# Patient Record
Sex: Female | Born: 1950 | Race: White | Hispanic: No | Smoking: Never smoker
Health system: Southern US, Community
[De-identification: ages and names within clinical notes are randomized; demographics above are authoritative.]

## PROBLEM LIST (undated history)

## (undated) DIAGNOSIS — C4491 Basal cell carcinoma of skin, unspecified: Secondary | ICD-10-CM

## (undated) DIAGNOSIS — Z8619 Personal history of other infectious and parasitic diseases: Secondary | ICD-10-CM

## (undated) DIAGNOSIS — T7840XA Allergy, unspecified, initial encounter: Secondary | ICD-10-CM

## (undated) HISTORY — DX: Allergy, unspecified, initial encounter: T78.40XA

## (undated) HISTORY — DX: Personal history of other infectious and parasitic diseases: Z86.19

## (undated) HISTORY — PX: TUBAL LIGATION: SHX77

## (undated) HISTORY — DX: Basal cell carcinoma of skin, unspecified: C44.91

---

## 2000-11-09 ENCOUNTER — Other Ambulatory Visit: Admission: RE | Admit: 2000-11-09 | Discharge: 2000-11-09 | Payer: Self-pay | Admitting: Obstetrics and Gynecology

## 2000-11-17 ENCOUNTER — Encounter
Admission: RE | Admit: 2000-11-17 | Discharge: 2000-11-17 | Payer: Self-pay | Admitting: Physical Medicine & Rehabilitation

## 2000-11-17 ENCOUNTER — Encounter: Payer: Self-pay | Admitting: Obstetrics and Gynecology

## 2000-11-26 ENCOUNTER — Other Ambulatory Visit: Admission: RE | Admit: 2000-11-26 | Discharge: 2000-11-26 | Payer: Self-pay | Admitting: Obstetrics and Gynecology

## 2005-01-27 ENCOUNTER — Ambulatory Visit (HOSPITAL_COMMUNITY): Admission: RE | Admit: 2005-01-27 | Discharge: 2005-01-27 | Payer: Self-pay | Admitting: Family Medicine

## 2006-03-24 ENCOUNTER — Other Ambulatory Visit: Admission: RE | Admit: 2006-03-24 | Discharge: 2006-03-24 | Payer: Self-pay | Admitting: Family Medicine

## 2013-12-03 ENCOUNTER — Ambulatory Visit (INDEPENDENT_AMBULATORY_CARE_PROVIDER_SITE_OTHER): Payer: BC Managed Care – PPO | Admitting: Family Medicine

## 2013-12-03 VITALS — BP 120/72 | HR 92 | Temp 98.4°F | Resp 18 | Ht 63.0 in | Wt 158.2 lb

## 2013-12-03 DIAGNOSIS — J329 Chronic sinusitis, unspecified: Secondary | ICD-10-CM

## 2013-12-03 DIAGNOSIS — J209 Acute bronchitis, unspecified: Secondary | ICD-10-CM

## 2013-12-03 MED ORDER — AZITHROMYCIN 250 MG PO TABS: ORAL_TABLET | ORAL | Status: DC

## 2013-12-03 NOTE — Progress Notes (Signed)
° °  Subjective:  This chart was scribed for Elvina Sidle, MD by Carl Best, Medical Scribe. This patient was seen in Room 2 and the patient's care was started at 8:09 AM.  Patient ID: Brittney Holt, female    DOB: 12-22-1951, 62 y.o.   MRN: [id number]  HPI HPI Comments: Brittney Holt is a 62 y.o. female who presents to the [facility] complaining of constant sinus pressure, cough, and sore throat that started last Saturday.  She states that her mucous is green in color.  She denies fever as an associated symptom.  She denies having a history of asthma.  She states that she has taken Zithromax with no problem.  The patient states that she is working in the [employer] at [employer].  She states that she has been working at [employer] since 1980.    Past Medical History  Diagnosis Date   Allergy    Past Surgical History  Procedure Laterality Date   Tubal ligation     Family History  Problem Relation Age of Onset   Cancer Mother    Heart disease Father    History   Social History   Marital Status: Divorced    Spouse Name: N/A    Number of Children: N/A   Years of Education: N/A   Occupational History   Not on file.   Social History Main Topics   Smoking status: Never Smoker    Smokeless tobacco: Not on file   Alcohol Use: Yes   Drug Use: No   Sexual Activity: Not on file   Other Topics Concern   Not on file   Social History Narrative   No narrative on file   Allergies  Allergen Reactions   Levaquin [Levofloxacin] Other (See Comments)    States that it raised something in her blood     Review of Systems  Constitutional: Negative for fever.  HENT: Positive for sinus pressure and sore throat.   Respiratory: Positive for cough.      Objective:  Physical Exam  Nursing note and vitals reviewed. Constitutional: She appears well-developed and well-nourished. No distress.  HENT:  Head: Normocephalic and  atraumatic.  Right Ear: External ear normal.  Left Ear: External ear normal.   Few ronchi Oroph:  Reddened posterior pharynx without exudates or swelling     Assessment & Plan:   I personally performed the services described in this documentation, which was scribed in my presence. The recorded information has been reviewed and is accurate. Sinusitis - Plan: azithromycin (ZITHROMAX Z-PAK) 250 MG tablet  Acute bronchitis - Plan: azithromycin (ZITHROMAX Z-PAK) 250 MG tablet  Signed, Elvina Sidle, MD

## 2015-02-14 ENCOUNTER — Ambulatory Visit (INDEPENDENT_AMBULATORY_CARE_PROVIDER_SITE_OTHER): Payer: Medicare Other

## 2015-02-14 ENCOUNTER — Ambulatory Visit (INDEPENDENT_AMBULATORY_CARE_PROVIDER_SITE_OTHER): Payer: Medicare Other | Admitting: Emergency Medicine

## 2015-02-14 VITALS — BP 120/76 | HR 116 | Temp 98.0°F | Resp 16 | Ht 62.5 in | Wt 177.0 lb

## 2015-02-14 DIAGNOSIS — R059 Cough, unspecified: Secondary | ICD-10-CM

## 2015-02-14 DIAGNOSIS — R05 Cough: Secondary | ICD-10-CM

## 2015-02-14 DIAGNOSIS — J189 Pneumonia, unspecified organism: Secondary | ICD-10-CM

## 2015-02-14 MED ORDER — HYDROCOD POLST-CHLORPHEN POLST 10-8 MG/5ML PO LQCR: 5.0000 mL | Freq: Two times a day (BID) | ORAL | Status: DC | PRN

## 2015-02-14 MED ORDER — CLARITHROMYCIN 500 MG PO TABS: 500.0000 mg | ORAL_TABLET | Freq: Two times a day (BID) | ORAL | Status: DC

## 2015-02-14 NOTE — Patient Instructions (Signed)

## 2015-02-14 NOTE — Progress Notes (Signed)
[facility] [address] [place] [address] [phone]  Date:  02/14/2015   Name:  Brittney Holt   DOB:  Dec 03, 1951   MRN:  [id number]  PCP:  No PCP Per Patient    Chief Complaint: Cough; Fever; and Wheezing   History of Present Illness:  Brittney Holt is a 64 y.o. very pleasant female patient who presents with the following:  Ill since Saturday night with cough productive mucopurulent sputum.   Wheezing intermittently.  Some exertional shortness of breath Fever to 102.  Spent two days in bed due to fatigue. No history of reactive airway disease. Nasal congestion and drainage mucopurulent. No nausea or vomiting.  No stool change. No improvement with over the counter medications or other home remedies.  Denies other complaint or health concern today.   There are no active problems to display for this patient.   Past Medical History  Diagnosis Date  . Allergy     Past Surgical History  Procedure Laterality Date  . Tubal ligation      History  Substance Use Topics  . Smoking status: Never Smoker   . Smokeless tobacco: Not on file  . Alcohol Use: Yes    Family History  Problem Relation Age of Onset  . Cancer Mother   . Heart disease Father     Allergies  Allergen Reactions  . Levaquin [Levofloxacin] Other (See Comments)    States that it raised something in her blood    Medication list has been reviewed and updated.  Current Outpatient Prescriptions on File Prior to Visit  Medication Sig Dispense Refill  . azithromycin (ZITHROMAX Z-PAK) 250 MG tablet Take as directed on pack (Patient not taking: Reported on 02/14/2015) 6 tablet 0  . Multiple Vitamin (MULTIVITAMIN) capsule Take 1 capsule by mouth daily.     No current facility-administered medications on file prior to visit.    Review of Systems:  As per HPI, otherwise negative.    Physical Examination: Filed Vitals:   02/14/15 1008  BP: 120/76  Pulse: 116  Temp: 98  F (36.7 C)  Resp: 16   Filed Vitals:   02/14/15 1008  Height: 5' 2.5" (1.588 m)  Weight: 177 lb (80.287 kg)   Body mass index is 31.84 kg/(m^2). Ideal Body Weight: Weight in (lb) to have BMI = 25: 138.6  GEN: obese, NAD, Non-toxic, A & O x 3 HEENT: Atraumatic, Normocephalic. Neck supple. No masses, No LAD. Ears and Nose: No external deformity.  Purulent nasal drainage CV: RRR, No M/G/R. No JVD. No thrill. No extra heart sounds. PULM: CTA B, no wheezes, crackles, rhonchi. No retractions. No resp. distress. No accessory muscle use. ABD: S, NT, ND, +BS. No rebound. No HSM. EXTR: No c/c/e NEURO Normal gait.  PSYCH: Normally interactive. Conversant. Not depressed or anxious appearing.  Calm demeanor.    Assessment and Plan: RLL infiltrate levaquin tussionex  Signed,  Ellison Carwin, MD   [facility] reading (PRIMARY) by  Dr. Ouida Sills.  RLL infiltrate.

## 2015-07-10 IMAGING — CR DG CHEST 2V
2 series · 2 of 2 positions shown · non-contrast
Comparison: January 27, 2005

CLINICAL DATA: Productive cough for 5 days.  Fever

EXAM:
CHEST  2 VIEW

[PA]
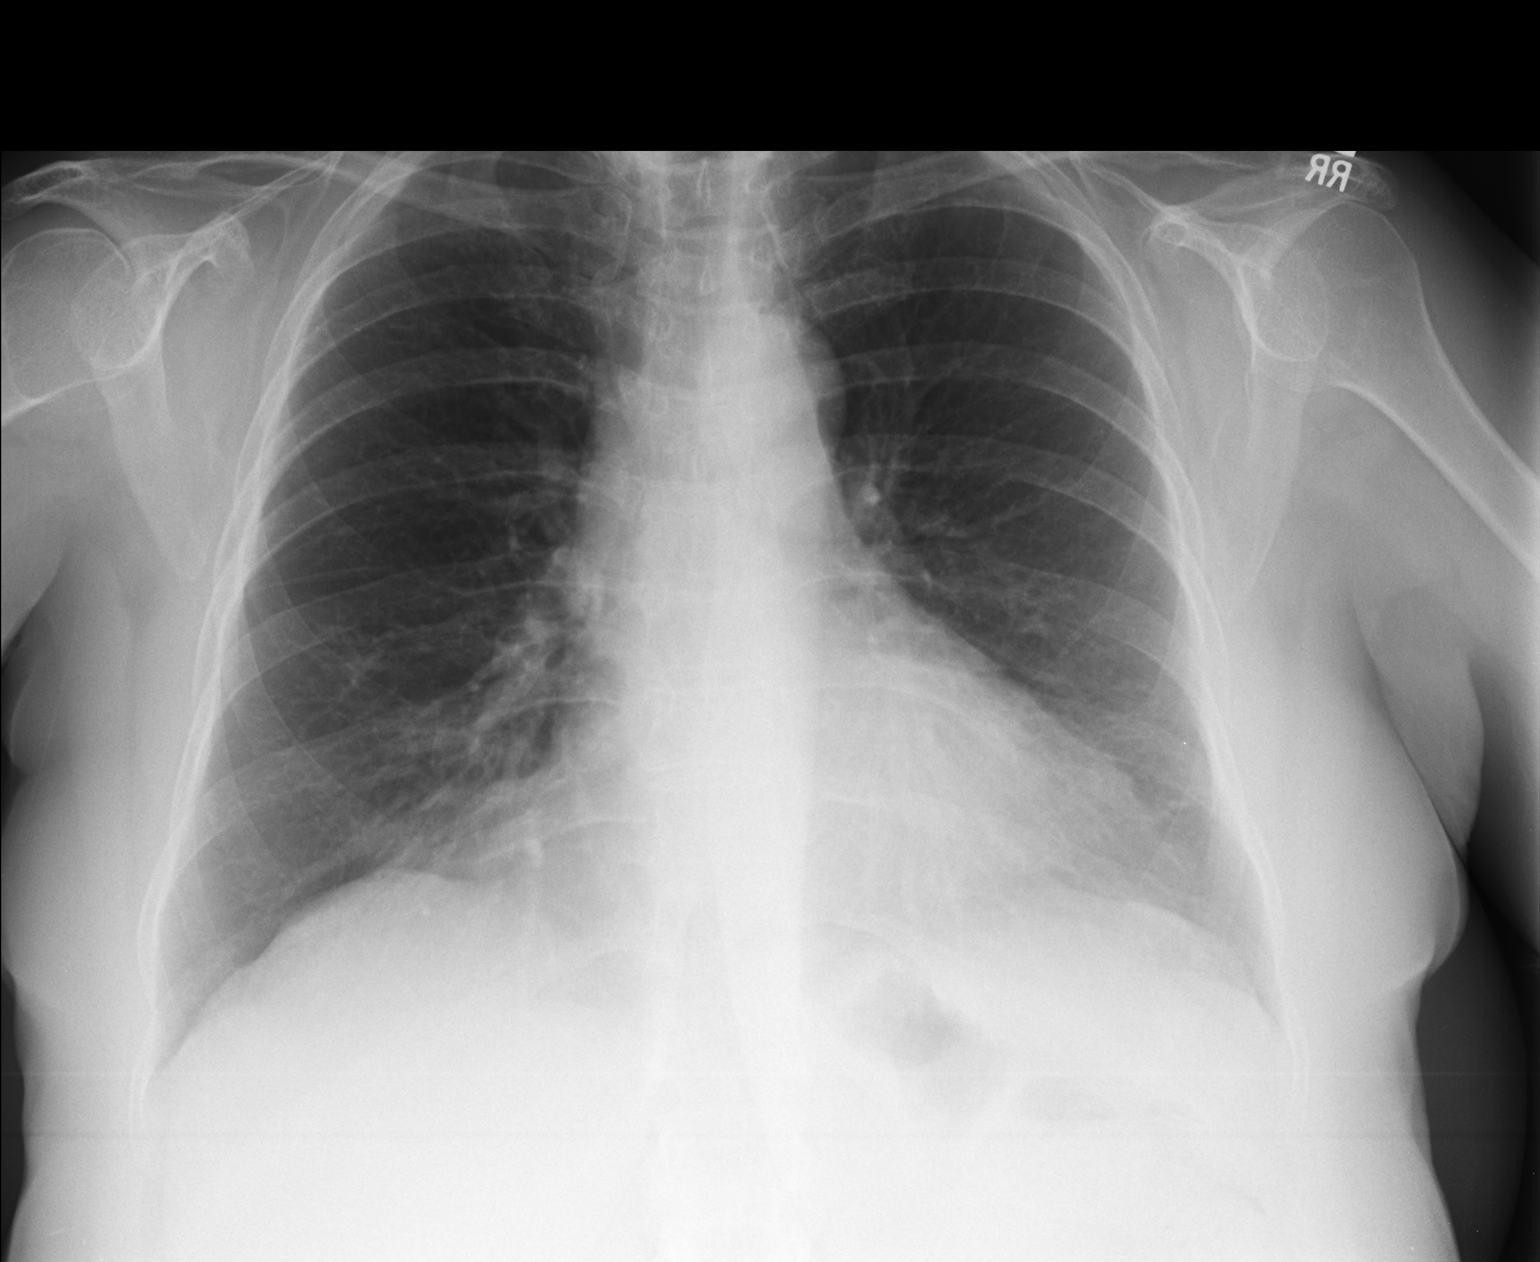

[lateral]
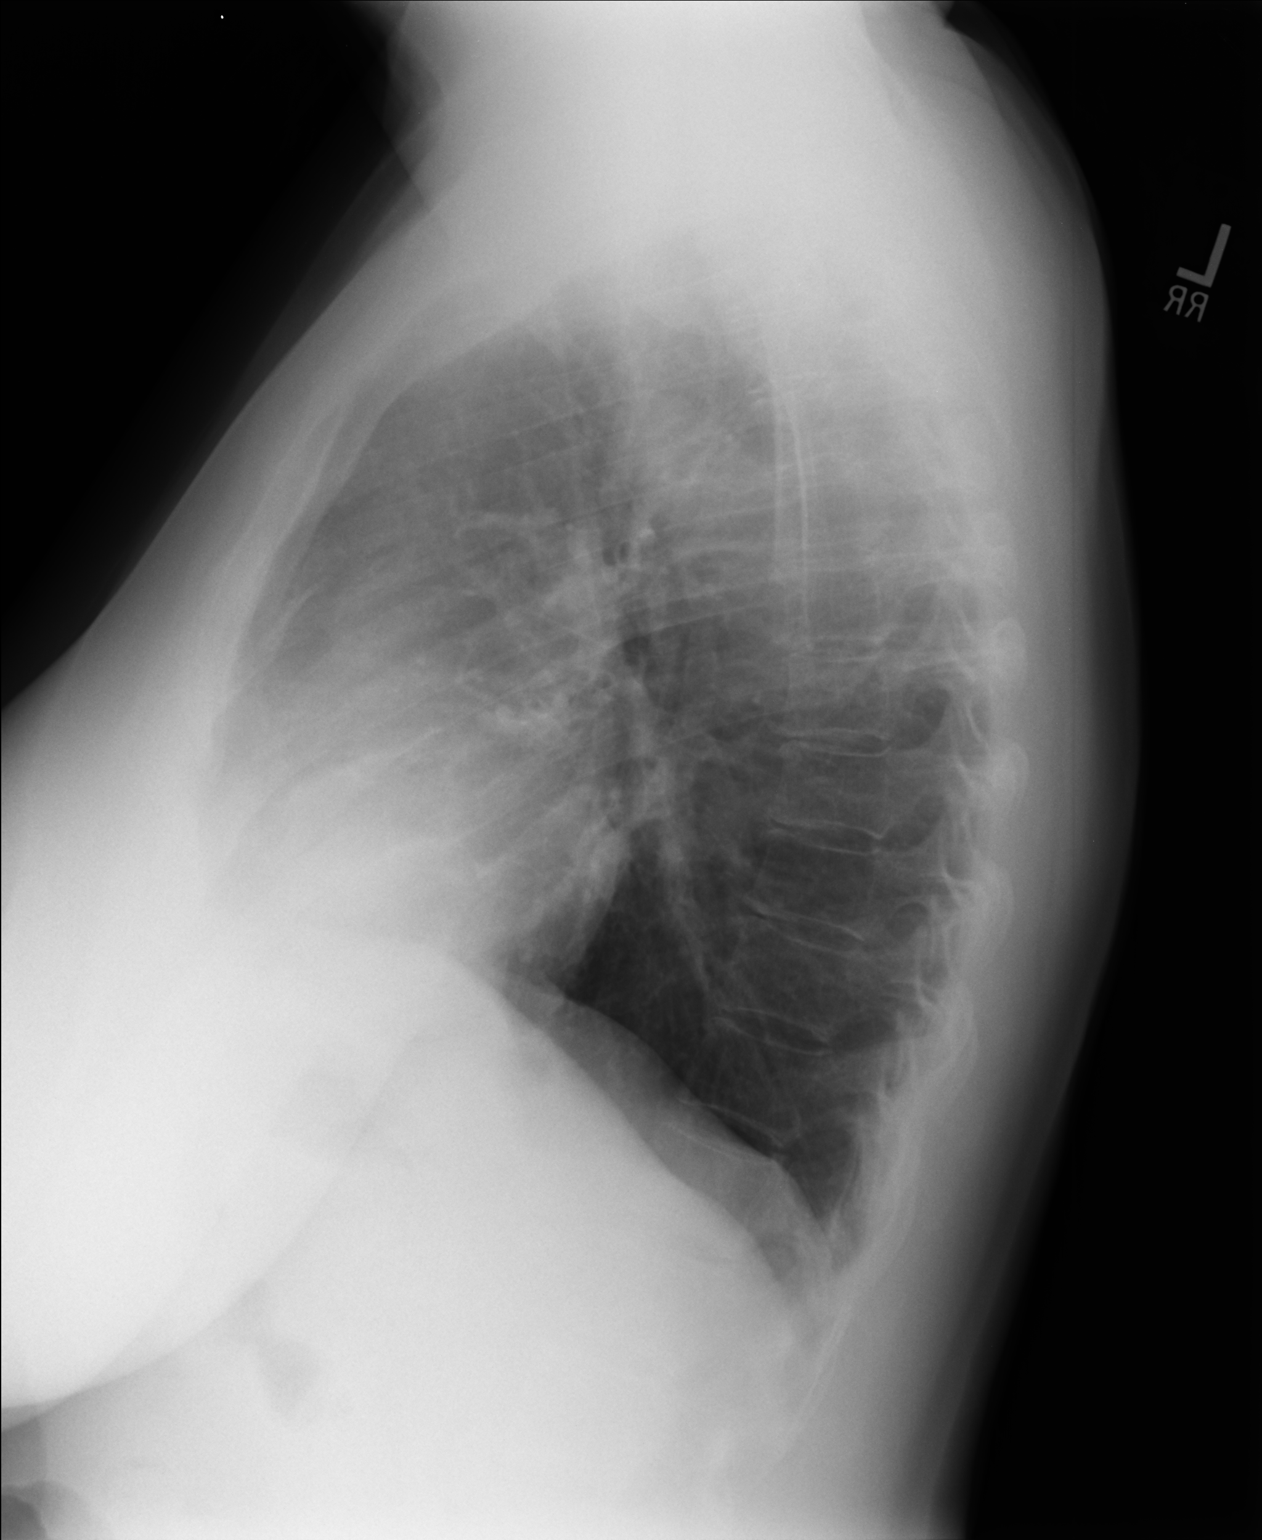

[2 of 2 positions shown; findings below may reference images not displayed]

FINDINGS: There is slight scarring in the left base. There is no edema or
consolidation. Heart is upper normal in size with pulmonary
vascularity within normal limits. No adenopathy. There is prominence
of epicardial fat at the right heart border.
IMPRESSION: Mild scarring left base. No edema or consolidation. Epicardial fat
prominence right heart border.

## 2015-10-23 ENCOUNTER — Telehealth: Payer: Self-pay | Admitting: Family Medicine

## 2015-10-23 NOTE — Telephone Encounter (Signed)
Left a message for patient to return call and schedule annual CPE

## 2017-03-01 ENCOUNTER — Other Ambulatory Visit (HOSPITAL_COMMUNITY)
Admission: RE | Admit: 2017-03-01 | Discharge: 2017-03-01 | Disposition: A | Discharge status: Home / Self Care | Payer: Medicare Other | Source: Ambulatory Visit | Attending: Family Medicine | Admitting: Family Medicine

## 2017-03-01 ENCOUNTER — Other Ambulatory Visit: Payer: Self-pay | Admitting: Family Medicine

## 2017-03-01 DIAGNOSIS — Z1151 Encounter for screening for human papillomavirus (HPV): Secondary | ICD-10-CM | POA: Diagnosis present

## 2017-03-01 DIAGNOSIS — Z124 Encounter for screening for malignant neoplasm of cervix: Secondary | ICD-10-CM | POA: Insufficient documentation

## 2017-03-01 LAB — LIPID PANEL
CHOLESTEROL: 219 — AB (ref 0–200)
Triglycerides: 130 (ref 40–160)

## 2017-03-01 LAB — HM PAP SMEAR

## 2017-03-03 LAB — CYTOLOGY - PAP
Diagnosis: NEGATIVE
HPV: NOT DETECTED

## 2017-03-11 LAB — HM DEXA SCAN

## 2017-03-11 LAB — HM MAMMOGRAPHY

## 2018-07-20 ENCOUNTER — Ambulatory Visit: Payer: Medicare Other | Admitting: Family Medicine

## 2018-07-26 ENCOUNTER — Encounter: Payer: Self-pay | Admitting: Family Medicine

## 2018-07-26 ENCOUNTER — Ambulatory Visit (INDEPENDENT_AMBULATORY_CARE_PROVIDER_SITE_OTHER): Payer: Medicare Other | Admitting: Family Medicine

## 2018-07-26 VITALS — BP 125/81 | HR 98 | Temp 98.6°F | Resp 17 | Ht 62.5 in | Wt 174.0 lb

## 2018-07-26 DIAGNOSIS — Z13220 Encounter for screening for lipoid disorders: Secondary | ICD-10-CM

## 2018-07-26 DIAGNOSIS — Z131 Encounter for screening for diabetes mellitus: Secondary | ICD-10-CM | POA: Diagnosis not present

## 2018-07-26 DIAGNOSIS — Z1211 Encounter for screening for malignant neoplasm of colon: Secondary | ICD-10-CM | POA: Diagnosis not present

## 2018-07-26 DIAGNOSIS — Z1231 Encounter for screening mammogram for malignant neoplasm of breast: Secondary | ICD-10-CM

## 2018-07-26 DIAGNOSIS — Z1239 Encounter for other screening for malignant neoplasm of breast: Secondary | ICD-10-CM

## 2018-07-26 DIAGNOSIS — Z0000 Encounter for general adult medical examination without abnormal findings: Secondary | ICD-10-CM | POA: Diagnosis not present

## 2018-07-26 LAB — COMPREHENSIVE METABOLIC PANEL
ALBUMIN: 4.1 g/dL (ref 3.6–4.8)
ALT: 18 IU/L (ref 0–32)
AST: 20 IU/L (ref 0–40)
Albumin/Globulin Ratio: 1.4 (ref 1.2–2.2)
Alkaline Phosphatase: 67 IU/L (ref 39–117)
BILIRUBIN TOTAL: 0.5 mg/dL (ref 0.0–1.2)
BUN / CREAT RATIO: 16 (ref 12–28)
BUN: 13 mg/dL (ref 8–27)
CO2: 20 mmol/L (ref 20–29)
Calcium: 9.9 mg/dL (ref 8.7–10.3)
Chloride: 103 mmol/L (ref 96–106)
Creatinine, Ser: 0.79 mg/dL (ref 0.57–1.00)
GFR calc non Af Amer: 77 mL/min/{1.73_m2} (ref >59)
GFR, EST AFRICAN AMERICAN: 88 mL/min/{1.73_m2} (ref >59)
GLUCOSE: 108 mg/dL — AB (ref 65–99)
Globulin, Total: 2.9 g/dL (ref 1.5–4.5)
POTASSIUM: 4.4 mmol/L (ref 3.5–5.2)
SODIUM: 138 mmol/L (ref 134–144)
TOTAL PROTEIN: 7 g/dL (ref 6.0–8.5)

## 2018-07-26 LAB — LIPID PANEL
Chol/HDL Ratio: 3.6 ratio (ref 0.0–4.4)
Cholesterol, Total: 253 mg/dL — ABNORMAL HIGH (ref 100–199)
HDL: 71 mg/dL (ref >39)
LDL Calculated: 142 mg/dL — ABNORMAL HIGH (ref 0–99)
Triglycerides: 200 mg/dL — ABNORMAL HIGH (ref 0–149)
VLDL Cholesterol Cal: 40 mg/dL (ref 5–40)

## 2018-07-26 MED ORDER — ALPRAZOLAM 0.25 MG PO TABS: 0.2500 mg | ORAL_TABLET | Freq: Every day | ORAL | 0 refills | Status: DC | PRN

## 2018-07-26 NOTE — Patient Instructions (Addendum)
We recommend that you schedule a mammogram for breast cancer screening. Typically, you do not need a referral to do this. Please contact a local imaging center to schedule your mammogram.  [facility] - [phone]  *ask for the Radiology Department [facility] - [phone] or [phone]  [facility] - [phone] [place] [phone] [facility] - [phone]  *ask for the [facility] - [phone]  *ask for the Radiology Department [facility] - [phone]  *ask for the [facility] - [phone]    IF you received an x-ray today, you will receive an invoice from [facility]. Please contact [facility] at [phone] with questions or concerns regarding your invoice.   IF you received labwork today, you will receive an invoice from [facility]. Please contact [facility] at [phone] with questions or concerns regarding your invoice.   Our billing staff will not be able to assist you with questions regarding bills from these companies.  You will be contacted with the lab results as soon as they are available. The fastest way to get your results is to activate your My Chart account. Instructions are located on the last page of this paperwork. If you have not heard from [place] regarding the results in 2 weeks, please contact this office.      Cancer Screening for Women A cancer screening is a test or exam that checks for cancer. Your health care provider will recommend specific cancer screenings based on your age, personal history, and family history of cancer. Work with your health care provider to create a cancer screening schedule that protects your health. Why is cancer screening done? Cancer screening is done to look for cancer in the very early stages, before it spreads and becomes harder to treat  and before you would start to notice symptoms. Finding cancer early improves the chances of successful treatment. It may save your life. Who should be screened for cancer? All women should be screened for certain cancers, including breast cancer, cervical cancer, and skin cancer. Your health care provider may recommend screenings for other types of cancer if:  You had cancer before.  You have a family member with cancer.  You have abnormal genes that could increase the risk of cancer.  You have risk factors for certain cancers, such as smoking.  When you should be screened for cancer depends on:  Your age.  Your medical history and your family's medical history.  Certain lifestyle factors, such as smoking.  Environmental exposure, such as to asbestos.  What are some common cancer screenings? Breast cancer Breast cancer screening is done with a test that takes images of breast tissue (mammogram). Here are some screening guidelines:  When you are age 74-44. you will be given the choice to start having mammograms.  When you are age 41-54, you should have a mammogram every year.  You may start having mammograms before age 2 if you have risk factors for breast cancer, such as having an immediate family member with breast cancer.  When you are age 85 or older, you should have a mammogram every 1-2 years for as long as you are in good health and have a life expectancy of 10 years or more.  It is important to know what your breasts look and feel like so you can report any changes to your health care provider.  Cervical cancer Cervical  cancer screening is done with a Pap test. This testchecks for abnormalities, including the virus that causes cervical cancer (human papillomavirus, or HPV). To perform the test, a health care provider takes a swab of cervical cells during a pelvic exam. Screening for cervical cancer with a Pap test should start at age 17. Here are some screening  guidelines:  When you are age 33-29, you should have a Pap test every 3 years.  When you are age 83-65, you should have a Pap test and HPV test every 5 years or have a Pap test every 3 years.  You may be screened for cervical cancer more often if you have risk factors for cervical cancer.  If your Pap tests are abnormal, you may have an HPV test.  If you have had the HPV vaccine, you will still be screened for cervical cancer and follow normal screening recommendations.  You do not need to be screened for cervical cancer if any of the following apply to you:  You are older than age 68 and you have not had a serious cervical precancer or cancer in the last 20 years.  Your cervix and uterus have been removed and you have never had cervical cancer or precancerous cells.  Endometrial cancer There is no standard screening test for endometrial cancer, but the cancer can be detected with:  A test of a sample of tissue taken from the lining of the uterus (endometrial tissue biopsy).  A vaginal ultrasound.  Pap tests.  If you are at increased risk for endometrial cancer, you may need to have these tests more often than normal. You are at increased risk if:  You have a family history of ovarian, uterine, or colon cancer.  You are taking tamoxifen, a drug that is used to treat breast cancer.  You have certain types of colon cancer.  If you have reached menopause, it is especially important to talk with your health care provider about any vaginal bleeding or spotting. Screening for endometrial cancer is not recommended for women who do not have symptoms of the cancer, such as vaginal bleeding. Colorectal cancer Screening for colorectal cancer is recommended starting at age 20 for most women. If you have a family history of colon or rectal cancer or other risk factors, you may need to start having screenings earlier. Talk with your health care provider about which screening test is right for  you and how often you should be screened. Colorectal cancer screening looks for cancer or for growths called polyps that often form before cancer starts. Tests to look for cancer or polyps include:  Colonoscopy or flexible sigmoidoscopy. For these procedures, a flexible tube with a small camera is inserted into the rectum.  CT colonography. This test uses X-rays and a contrast dye to check the colon for polyps. If a polyp is found, you may need to have a colonoscopy so the polyp can be located and removed.  Tests to look for cancer in the stool (feces) include:  Guaiac-based fecal occult blood test (FOBT). This test detects blood in stool. It can be done at home with a kit.  Fecal immunochemical test (FIT). This test detects blood in stool. For this test, you will need to collect stool samples at home.  Stool DNA test. This test looks for blood in stool and any changes in DNA that can lead to colon cancer. For this test, you will need to collect a stool sample at home and send it to a  lab.  Skin cancer Skin cancer screening is done by checking the skin for unusual moles or spots and any changes in existing moles. Your health care provider should check your skin for signs of skin cancer at every physical exam. You should check your skin every month and tell your health care provider right away if anything looks unusual. Women with a higher-than-normal risk for skin cancer may want to see a skin specialist (dermatologist) for an annual body check. Lung cancer Lung cancer screening is done with a CT scan that looks for abnormal cells in the lungs. Discuss lung cancer screening with your health care provider if you are 54-53 years old and if any of the following apply to you:  You currently smoke.  You used to smoke heavily.  You have had at least a 30-pack-year smoking history.  You have quit smoking within the past 15 years.  If you smoke heavily or if you used to smoke, you may need to be  screened every year. Where to find more information:  [place]: SkinPromotion.no  Centers for Disease Control and Prevention: http://knight-sullivan.biz/  Department of Health and Human Services: BankingDetective.si Contact a health care provider if:  You have concerns about any signs or symptoms of cancer, such as: ? Moles that have an unusual shape or color. ? Changes in existing moles. ? A sore on your skin that does not heal. ? Blood in your stool. ? Fatigue that does not go away. ? Frequent pain or cramping in your abdomen. ? Coughing, or coughing up blood. ? Losing weight without trying. ? Lumps or other changes in your breasts. ? Vaginal bleeding, spotting, or changes in your periods. Summary  Be aware of and watch for signs and symptoms of cancer, especially symptoms of breast cancer, cervical cancer, endometrial cancer, colorectal cancer, skin cancer, and lung cancer.  Early detection of cancer with cancer screening may save your life.  Talk with your health care provider about your specific cancer risks.  Work together with your health care provider to create a cancer screening plan that is right for you. This information is not intended to replace advice given to you by your health care provider. Make sure you discuss any questions you have with your health care provider. Document Released: 09/10/2016 Document Revised: 09/10/2016 Document Reviewed: 09/10/2016 Elsevier Interactive Patient Education  [employer].

## 2018-07-26 NOTE — Progress Notes (Signed)
Chief Complaint  Patient presents with  . Annual Exam    Subjective:  Brittney Holt is a 67 y.o. female here for a health maintenance visit.  Patient is established pt  There are no active problems to display for this patient.  Pt reports that she lives with anxiety but gets panic attacks when flying or stuck in enclosed spaces She used to take some of her sister's xanax for flying She would like a prescription of her own  Colon Cancer Screening She has never had a colonoscopy she denies blood in his stool, unexpected weight loss or pain with defecation No rectal itching she does not smoke she does not have a family history of colon cancer    Past Medical History:  Diagnosis Date  . Allergy     Past Surgical History:  Procedure Laterality Date  . TUBAL LIGATION       Outpatient Medications Prior to Visit  Medication Sig Dispense Refill  . azithromycin (ZITHROMAX Z-PAK) 250 MG tablet Take as directed on pack (Patient not taking: Reported on 02/14/2015) 6 tablet 0  . chlorpheniramine-HYDROcodone (TUSSIONEX PENNKINETIC ER) 10-8 MG/5ML LQCR Take 5 mLs by mouth every 12 (twelve) hours as needed. 60 mL 0  . clarithromycin (BIAXIN) 500 MG tablet Take 1 tablet (500 mg total) by mouth 2 (two) times daily. 20 tablet 0  . Multiple Vitamin (MULTIVITAMIN) capsule Take 1 capsule by mouth daily.     No facility-administered medications prior to visit.     Allergies  Allergen Reactions  . Levaquin [Levofloxacin] Other (See Comments)    States that it raised something in her blood     Family History  Problem Relation Age of Onset  . Cancer Mother   . Heart disease Father      Health Habits: Dental Exam: not up to date Eye Exam: up to date Exercise: 7 times/week on average Current exercise activities: walking with her dog Diet: balanced   Social History   Socioeconomic History  . Marital status: Divorced    Spouse name: Not on file  . Number of children: Not on  file  . Years of education: Not on file  . Highest education level: Not on file  Occupational History  . Not on file  Social Needs  . Financial resource strain: Not on file  . Food insecurity:    Worry: Not on file    Inability: Not on file  . Transportation needs:    Medical: Not on file    Non-medical: Not on file  Tobacco Use  . Smoking status: Never Smoker  . Smokeless tobacco: Never Used  Substance and Sexual Activity  . Alcohol use: Yes  . Drug use: No  . Sexual activity: Not on file  Lifestyle  . Physical activity:    Days per week: Not on file    Minutes per session: Not on file  . Stress: Not on file  Relationships  . Social connections:    Talks on phone: Not on file    Gets together: Not on file    Attends religious service: Not on file    Active member of club or organization: Not on file    Attends meetings of clubs or organizations: Not on file    Relationship status: Not on file  . Intimate partner violence:    Fear of current or ex partner: Not on file    Emotionally abused: Not on file    Physically abused: Not on file  Forced sexual activity: Not on file  Other Topics Concern  . Not on file  Social History Narrative  . Not on file   Social History   Substance and Sexual Activity  Alcohol Use Yes   Social History   Tobacco Use  Smoking Status Never Smoker  Smokeless Tobacco Never Used   Social History   Substance and Sexual Activity  Drug Use No    GYN: Sexual Health Menstrual status: regular menses LMP: No LMP recorded. Patient is postmenopausal. Last pap smear: see HM section History of abnormal pap smears:  Sexually active: no, celibate    Health Maintenance: See under health Maintenance activity for review of completion dates as well.  There is no immunization history on file for this patient.    Depression Screen-PHQ2/9 Depression screen [facility] 2/9 07/26/2018  Decreased Interest 0  Down, Depressed, Hopeless 0  PHQ - 2  Score 0    Depression Severity and Treatment Recommendations:  0-4= None  5-9= Mild / Treatment: Support, educate to call if worse; return in one month  10-14= Moderate / Treatment: Support, watchful waiting; Antidepressant or Psycotherapy  15-19= Moderately severe / Treatment: Antidepressant OR Psychotherapy  >= 20 = Major depression, severe / Antidepressant AND Psychotherapy    Review of Systems   Review of Systems  Constitutional: Negative for chills and fever.  Respiratory: Negative for cough, shortness of breath and wheezing.   Cardiovascular: Negative for chest pain and palpitations.  Gastrointestinal: Negative for abdominal pain, constipation, diarrhea, nausea and vomiting.  Genitourinary: Negative for dysuria, frequency and urgency.  Skin: Negative for itching and rash.  Neurological: Negative for dizziness, tingling, tremors and headaches.  Psychiatric/Behavioral: Negative for depression. The patient is nervous/anxious.        Reports claustrophobia and fear of flying     See HPI for ROS as well.    Objective:   Vitals:   07/26/18 0908  BP: 125/81  Pulse: 98  Resp: 17  Temp: 98.6 F (37 C)  TempSrc: Oral  SpO2: 98%  Weight: 174 lb (78.9 kg)  Height: 5' 2.5" (1.588 m)    Body mass index is 31.32 kg/m.  Physical Exam  Constitutional: She is oriented to person, place, and time. She appears well-developed and well-nourished.  HENT:  Head: Normocephalic and atraumatic.  Eyes: Conjunctivae and EOM are normal.  Neck: Normal range of motion. Neck supple. No thyromegaly present.  Cardiovascular: Normal rate, regular rhythm and normal heart sounds.  No murmur heard. Pulmonary/Chest: Effort normal and breath sounds normal. No stridor. No respiratory distress. She has no wheezes.  Abdominal: Soft. Bowel sounds are normal. She exhibits no distension and no mass. There is no tenderness. There is no guarding.  Neurological: She is alert and oriented to person, place,  and time.  Skin: Skin is warm. Capillary refill takes less than 2 seconds.  Psychiatric: She has a normal mood and affect. Her behavior is normal. Judgment and thought content normal.   Chaperone present Breast exam: symmetric breasts, no nipple discharge, no skin changes, no masses    Assessment/Plan:   Patient was seen for a health maintenance exam.  Counseled the patient on health maintenance issues. Reviewed her health mainteance schedule and ordered appropriate tests (see orders.) Counseled on regular exercise and weight management. Recommend regular eye exams and dental cleaning.   The following issues were addressed today for health maintenance:   Quantisha was seen today for annual exam.  Diagnoses and all orders for this visit:  Encounter for Medicare annual wellness exam- discussed age appropriate screenings   Screening for colon cancer- pt opted for cologuard after all the options were discussed  -     Cologuard; Future  Screening, lipid -     Lipid panel -     Comprehensive metabolic panel  Screening for diabetes mellitus -     Lipid panel -     Comprehensive metabolic panel  Other orders -     ALPRAZolam (XANAX) 0.25 MG tablet; Take 1 tablet (0.25 mg total) by mouth daily as needed for anxiety.  Screening for breast cancer -    No follow-ups on file.    Body mass index is 31.32 kg/m.:  Discussed the patient's BMI with patient. The BMI body mass index is 31.32 kg/m.     No future appointments.  Patient Instructions       IF you received an x-ray today, you will receive an invoice from [facility]. Please contact [facility] at [phone] with questions or concerns regarding your invoice.   IF you received labwork today, you will receive an invoice from [place]. Please contact [facility] at [phone] with questions or concerns regarding your invoice.   Our billing staff will not be able to assist you with questions regarding  bills from these companies.  You will be contacted with the lab results as soon as they are available. The fastest way to get your results is to activate your My Chart account. Instructions are located on the last page of this paperwork. If you have not heard from [place] regarding the results in 2 weeks, please contact this office.      Cancer Screening for Women A cancer screening is a test or exam that checks for cancer. Your health care provider will recommend specific cancer screenings based on your age, personal history, and family history of cancer. Work with your health care provider to create a cancer screening schedule that protects your health. Why is cancer screening done? Cancer screening is done to look for cancer in the very early stages, before it spreads and becomes harder to treat and before you would start to notice symptoms. Finding cancer early improves the chances of successful treatment. It may save your life. Who should be screened for cancer? All women should be screened for certain cancers, including breast cancer, cervical cancer, and skin cancer. Your health care provider may recommend screenings for other types of cancer if:  You had cancer before.  You have a family member with cancer.  You have abnormal genes that could increase the risk of cancer.  You have risk factors for certain cancers, such as smoking.  When you should be screened for cancer depends on:  Your age.  Your medical history and your family's medical history.  Certain lifestyle factors, such as smoking.  Environmental exposure, such as to asbestos.  What are some common cancer screenings? Breast cancer Breast cancer screening is done with a test that takes images of breast tissue (mammogram). Here are some screening guidelines:  When you are age 73-44. you will be given the choice to start having mammograms.  When you are age 45-54, you should have a mammogram every year.  You may  start having mammograms before age 84 if you have risk factors for breast cancer, such as having an immediate family member with breast cancer.  When you are age 1 or older, you should have a mammogram every 1-2 years for as long as you are  in good health and have a life expectancy of 10 years or more.  It is important to know what your breasts look and feel like so you can report any changes to your health care provider.  Cervical cancer Cervical cancer screening is done with a Pap test. This testchecks for abnormalities, including the virus that causes cervical cancer (human papillomavirus, or HPV). To perform the test, a health care provider takes a swab of cervical cells during a pelvic exam. Screening for cervical cancer with a Pap test should start at age 76. Here are some screening guidelines:  When you are age 88-29, you should have a Pap test every 3 years.  When you are age 68-65, you should have a Pap test and HPV test every 5 years or have a Pap test every 3 years.  You may be screened for cervical cancer more often if you have risk factors for cervical cancer.  If your Pap tests are abnormal, you may have an HPV test.  If you have had the HPV vaccine, you will still be screened for cervical cancer and follow normal screening recommendations.  You do not need to be screened for cervical cancer if any of the following apply to you:  You are older than age 32 and you have not had a serious cervical precancer or cancer in the last 20 years.  Your cervix and uterus have been removed and you have never had cervical cancer or precancerous cells.  Endometrial cancer There is no standard screening test for endometrial cancer, but the cancer can be detected with:  A test of a sample of tissue taken from the lining of the uterus (endometrial tissue biopsy).  A vaginal ultrasound.  Pap tests.  If you are at increased risk for endometrial cancer, you may need to have these tests  more often than normal. You are at increased risk if:  You have a family history of ovarian, uterine, or colon cancer.  You are taking tamoxifen, a drug that is used to treat breast cancer.  You have certain types of colon cancer.  If you have reached menopause, it is especially important to talk with your health care provider about any vaginal bleeding or spotting. Screening for endometrial cancer is not recommended for women who do not have symptoms of the cancer, such as vaginal bleeding. Colorectal cancer Screening for colorectal cancer is recommended starting at age 103 for most women. If you have a family history of colon or rectal cancer or other risk factors, you may need to start having screenings earlier. Talk with your health care provider about which screening test is right for you and how often you should be screened. Colorectal cancer screening looks for cancer or for growths called polyps that often form before cancer starts. Tests to look for cancer or polyps include:  Colonoscopy or flexible sigmoidoscopy. For these procedures, a flexible tube with a small camera is inserted into the rectum.  CT colonography. This test uses X-rays and a contrast dye to check the colon for polyps. If a polyp is found, you may need to have a colonoscopy so the polyp can be located and removed.  Tests to look for cancer in the stool (feces) include:  Guaiac-based fecal occult blood test (FOBT). This test detects blood in stool. It can be done at home with a kit.  Fecal immunochemical test (FIT). This test detects blood in stool. For this test, you will need to collect stool samples at home.  Stool DNA test. This test looks for blood in stool and any changes in DNA that can lead to colon cancer. For this test, you will need to collect a stool sample at home and send it to a lab.  Skin cancer Skin cancer screening is done by checking the skin for unusual moles or spots and any changes in existing  moles. Your health care provider should check your skin for signs of skin cancer at every physical exam. You should check your skin every month and tell your health care provider right away if anything looks unusual. Women with a higher-than-normal risk for skin cancer may want to see a skin specialist (dermatologist) for an annual body check. Lung cancer Lung cancer screening is done with a CT scan that looks for abnormal cells in the lungs. Discuss lung cancer screening with your health care provider if you are 33-56 years old and if any of the following apply to you:  You currently smoke.  You used to smoke heavily.  You have had at least a 30-pack-year smoking history.  You have quit smoking within the past 15 years.  If you smoke heavily or if you used to smoke, you may need to be screened every year. Where to find more information:  [place]: SkinPromotion.no  Centers for Disease Control and Prevention: http://knight-sullivan.biz/  Department of Health and Human Services: BankingDetective.si Contact a health care provider if:  You have concerns about any signs or symptoms of cancer, such as: ? Moles that have an unusual shape or color. ? Changes in existing moles. ? A sore on your skin that does not heal. ? Blood in your stool. ? Fatigue that does not go away. ? Frequent pain or cramping in your abdomen. ? Coughing, or coughing up blood. ? Losing weight without trying. ? Lumps or other changes in your breasts. ? Vaginal bleeding, spotting, or changes in your periods. Summary  Be aware of and watch for signs and symptoms of cancer, especially symptoms of breast cancer, cervical cancer, endometrial cancer, colorectal cancer, skin cancer, and lung cancer.  Early detection of cancer with cancer screening may save your life.  Talk with your  health care provider about your specific cancer risks.  Work together with your health care provider to create a cancer screening plan that is right for you. This information is not intended to replace advice given to you by your health care provider. Make sure you discuss any questions you have with your health care provider. Document Released: 09/10/2016 Document Revised: 09/10/2016 Document Reviewed: 09/10/2016 Elsevier Interactive Patient Education  [employer].

## 2018-12-19 ENCOUNTER — Encounter: Payer: Self-pay | Admitting: *Deleted

## 2019-01-06 ENCOUNTER — Ambulatory Visit: Payer: Medicare Other | Admitting: Family Medicine

## 2019-01-06 ENCOUNTER — Encounter: Payer: Self-pay | Admitting: Family Medicine

## 2019-01-06 VITALS — BP 146/86 | HR 90 | Temp 97.6°F | Ht 62.5 in | Wt 182.2 lb

## 2019-01-06 DIAGNOSIS — Z1159 Encounter for screening for other viral diseases: Secondary | ICD-10-CM

## 2019-01-06 DIAGNOSIS — Z23 Encounter for immunization: Secondary | ICD-10-CM

## 2019-01-06 DIAGNOSIS — Z1211 Encounter for screening for malignant neoplasm of colon: Secondary | ICD-10-CM

## 2019-01-06 DIAGNOSIS — R030 Elevated blood-pressure reading, without diagnosis of hypertension: Secondary | ICD-10-CM | POA: Diagnosis not present

## 2019-01-06 DIAGNOSIS — R7309 Other abnormal glucose: Secondary | ICD-10-CM | POA: Diagnosis not present

## 2019-01-06 DIAGNOSIS — E785 Hyperlipidemia, unspecified: Secondary | ICD-10-CM | POA: Insufficient documentation

## 2019-01-06 DIAGNOSIS — M8580 Other specified disorders of bone density and structure, unspecified site: Secondary | ICD-10-CM | POA: Insufficient documentation

## 2019-01-06 LAB — HEMOGLOBIN A1C: Hgb A1c MFr Bld: 5.6 % (ref 4.6–6.5)

## 2019-01-06 NOTE — Progress Notes (Deleted)
Patient: Brittney Holt MRN: [id number] DOB: Aug 03, 1951 PCP: Orma Flaming, MD     Subjective:  Chief Complaint  Patient presents with  . Establish Care    HPI: The patient is a 68 y.o. female who presents today for ***  Review of Systems  Constitutional: Positive for appetite change. Negative for fatigue.  Eyes: Negative for visual disturbance.  Respiratory: Negative for shortness of breath.   Cardiovascular: Negative for chest pain.  Gastrointestinal: Negative for abdominal pain, constipation, diarrhea and nausea.  Musculoskeletal: Negative for arthralgias, back pain and neck pain.  Skin: Negative.   Neurological: Negative for dizziness and headaches.  Psychiatric/Behavioral: Negative for dysphoric mood and sleep disturbance. The patient is nervous/anxious.     Allergies Patient is allergic to levaquin [levofloxacin].  Past Medical History Patient  has a past medical history of Allergy and Basal cell carcinoma.  Surgical History Patient  has a past surgical history that includes Tubal ligation.  Family History Pateint's family history includes Cancer in her mother; Heart disease in her father.  Social History Patient  reports that she has never smoked. She has never used smokeless tobacco. She reports current alcohol use. She reports that she does not use drugs.    Objective: Vitals:   01/06/19 1111  BP: (!) 164/98  Pulse: 90  Temp: 97.6 F (36.4 C)  TempSrc: Oral  SpO2: 97%  Weight: 182 lb 3.2 oz (82.6 kg)  Height: 5' 2.5" (1.588 m)    Body mass index is 32.79 kg/m.  Physical Exam Depression screen [facility] 2/9 01/06/2019 07/26/2018  Decreased Interest 0 0  Down, Depressed, Hopeless 0 0  PHQ - 2 Score 0 0       Assessment/plan:      No follow-ups on file.     @AWME @ 01/06/2019

## 2019-01-06 NOTE — Progress Notes (Signed)
Patient: Brittney Holt MRN: [id number] DOB: 11-08-1951 PCP: Orma Flaming, MD     Subjective:  Chief Complaint  Patient presents with  . Establish Care    HPI: The patient is a 68 y.o. female who presents today for establishing care. She really has no medical history. Last AWV done in 06/2018. She does have abnormal glucose and elevated blood pressure today. She has no hx of HTN. Overall feels well with no complaints.   Due for mmg in 02/2019 Due for colon cancer screening Hep C screen: not done Pneumovax 23: never. pcv13 done Flu: not had   Review of Systems  Constitutional: Negative for chills, fatigue and fever.  HENT: Negative for dental problem, ear pain, hearing loss and trouble swallowing.   Eyes: Negative for visual disturbance.  Respiratory: Negative for cough, chest tightness and shortness of breath.   Cardiovascular: Negative for chest pain, palpitations and leg swelling.  Gastrointestinal: Negative for abdominal pain, blood in stool, diarrhea and nausea.  Endocrine: Negative for cold intolerance, polydipsia, polyphagia and polyuria.  Genitourinary: Negative for dysuria and hematuria.  Musculoskeletal: Negative for arthralgias.  Skin: Negative for rash.  Neurological: Negative for dizziness and headaches.  Psychiatric/Behavioral: Negative for dysphoric mood and sleep disturbance. The patient is not nervous/anxious.     Allergies Patient is allergic to levaquin [levofloxacin].  Past Medical History Patient  has a past medical history of Allergy and Basal cell carcinoma.  Surgical History Patient  has a past surgical history that includes Tubal ligation.  Family History Pateint's family history includes Cancer in her mother; Heart disease in her father.  Social History Patient  reports that she has never smoked. She has never used smokeless tobacco. She reports current alcohol use. She reports that she does not use drugs.    Objective: Vitals:   01/06/19  1111 01/06/19 1141  BP: (!) 164/98 (!) 146/86  Pulse: 90   Temp: 97.6 F (36.4 C)   TempSrc: Oral   SpO2: 97%   Weight: 182 lb 3.2 oz (82.6 kg)   Height: 5' 2.5" (1.588 m)     Body mass index is 32.79 kg/m.  Physical Exam Vitals signs reviewed.  Constitutional:      Appearance: She is well-developed.  HENT:     Head:     Comments: Tm partially occluded with cerumen     Right Ear: Ear canal and external ear normal.     Left Ear: Ear canal and external ear normal.  Eyes:     Conjunctiva/sclera: Conjunctivae normal.     Pupils: Pupils are equal, round, and reactive to light.  Neck:     Musculoskeletal: Normal range of motion and neck supple.     Thyroid: No thyromegaly.  Cardiovascular:     Rate and Rhythm: Normal rate and regular rhythm.     Heart sounds: Normal heart sounds. No murmur.  Pulmonary:     Effort: Pulmonary effort is normal.     Breath sounds: Normal breath sounds.  Abdominal:     General: Bowel sounds are normal. There is no distension.     Palpations: Abdomen is soft.     Tenderness: There is no abdominal tenderness.  Lymphadenopathy:     Cervical: No cervical adenopathy.  Skin:    General: Skin is warm and dry.     Findings: No rash.  Neurological:     Mental Status: She is alert and oriented to person, place, and time.     Cranial Nerves: No  cranial nerve deficit.     Coordination: Coordination normal.     Deep Tendon Reflexes: Reflexes normal.  Psychiatric:        Behavior: Behavior normal.        Assessment/plan: 1. Elevated blood-pressure reading without diagnosis of hypertension Repeat closer to goal. All of her past readings have been excellent. Will have her keep home log and she will come back here in a month for nurse BP check. She is also going to work on [employer] loss/exercise/diet changes.   2. Need for vaccination for Strep pneumoniae  - Pneumococcal polysaccharide vaccine 23-valent greater than or equal to 2yo subcutaneous/IM  3.  Need for prophylactic vaccination and inoculation against influenza  - Flu vaccine HIGH DOSE PF (Fluzone High dose)  4. Encounter for hepatitis C screening test for low risk patient  - Hepatitis C antibody  5. Abnormal glucose  - Hemoglobin A1c  6. Colon cancer screening  - Cologuard  Requesting records as well. Labs reviewed from past appointment.    Return in about 8 months (around 09/07/2019) for medicare .    Orma Flaming, MD [place]  01/06/2019

## 2019-01-07 LAB — HEPATITIS C ANTIBODY
Hepatitis C Ab: NONREACTIVE
SIGNAL TO CUT-OFF: 0.02 (ref <1.00)

## 2019-03-31 LAB — MR MRA CAROTID

## 2019-03-31 LAB — HPV APTIMA: .: NEGATIVE

## 2019-03-31 LAB — MR CERVICAL SPINE W WO CONTRAST

## 2019-06-06 ENCOUNTER — Encounter: Payer: Self-pay | Admitting: Family Medicine

## 2019-06-06 ENCOUNTER — Other Ambulatory Visit: Payer: Self-pay

## 2019-06-06 ENCOUNTER — Ambulatory Visit (INDEPENDENT_AMBULATORY_CARE_PROVIDER_SITE_OTHER): Payer: Medicare Other | Admitting: Family Medicine

## 2019-06-06 VITALS — BP 131/76

## 2019-06-06 DIAGNOSIS — M25362 Other instability, left knee: Secondary | ICD-10-CM | POA: Diagnosis not present

## 2019-06-06 NOTE — Progress Notes (Signed)
Virtual Visit via Video Note  Subjective  CC:  Chief Complaint  Patient presents with  . Knee Weakness    Left knee.. Happens with walking and/or standing, more episodal and last about 5-10 minutes.. Denies any injury     I connected with Brittney Holt on 06/06/19 at 11:00 AM EDT by a video enabled telemedicine application and verified that I am speaking with the correct person using two identifiers. Location patient: Home Location provider: [facility] [place], Office Persons participating in the virtual visit: MACKENZEY CROWNOVER, Leamon Arnt, MD Lilli Light, [place] discussed the limitations of evaluation and management by telemedicine and the availability of in person appointments. The patient expressed understanding and agreed to proceed. HPI: Brittney Holt is a 68 y.o. female who was contacted today to address the problems listed above in the chief complaint. . This is a new patient to me.  I have reviewed her chart.  Very pleasant 68 year old female who is present with her concerned daughter.  She reports over the years she has had mild "twitches "in her left knee.  Very minimal sensations of giving way intermittently.  She never thought anything of it.  Over the last 3 weeks, however she has had 2 significant episodes where while walking she felt sudden give way in her left knee, both times needing to brace herself in order to prevent falls.  Afterwards, she felt fine.  She never had swelling, pain, redness.  She denies injury.  She has no history of chronic knee pain or osteoarthritis.  Her children are concerned and she wants to prevent a fall or complication thereof.  Assessment  1. Knee gives way, left      Plan   Left knee, give way symptoms: Patient needs an in person evaluation with the knee exam.  Will refer to sports medicine so that she can get a good knee exam and then decide if further testing, evaluation is warranted.  For now she will  use an elastic knee brace for support. I discussed the assessment and treatment plan with the patient. The patient was provided an opportunity to ask questions and all were answered. The patient agreed with the plan and demonstrated an understanding of the instructions.   The patient was advised to call back or seek an in-person evaluation if the symptoms worsen or if the condition fails to improve as anticipated. Follow up: Referred to sports medicine. 09/06/2019  No orders of the defined types were placed in this encounter.     I reviewed the patients updated PMH, FH, and SocHx.    Patient Active Problem List   Diagnosis Date Noted  . Osteopenia 01/06/2019  . Hyperlipidemia 01/06/2019   No outpatient medications have been marked as taking for the 06/06/19 encounter (Office Visit) with Leamon Arnt, MD.    Allergies: Patient is allergic to levaquin [levofloxacin]. Family History: Patient family history includes Asthma in her daughter; COPD in her sister; Cancer in her mother; Heart attack in her father and sister; Heart disease in her father. Social History:  Patient  reports that she has never smoked. She has never used smokeless tobacco. She reports current alcohol use. She reports that she does not use drugs.  Review of Systems: Constitutional: Negative for fever malaise or anorexia Cardiovascular: negative for chest pain Respiratory: negative for SOB or persistent cough Gastrointestinal: negative for abdominal pain  OBJECTIVE Vitals: BP 131/76   LMP  (  LMP Unknown)  General: no acute distress , A&Ox3  Leamon Arnt, MD

## 2019-06-23 ENCOUNTER — Other Ambulatory Visit: Payer: Self-pay

## 2019-06-23 ENCOUNTER — Ambulatory Visit: Payer: Medicare Other | Admitting: Family Medicine

## 2019-06-23 ENCOUNTER — Encounter: Payer: Self-pay | Admitting: Family Medicine

## 2019-06-23 ENCOUNTER — Ambulatory Visit: Payer: Self-pay

## 2019-06-23 VITALS — BP 120/88 | HR 116 | Ht 62.5 in

## 2019-06-23 DIAGNOSIS — G8929 Other chronic pain: Secondary | ICD-10-CM

## 2019-06-23 DIAGNOSIS — M1712 Unilateral primary osteoarthritis, left knee: Secondary | ICD-10-CM

## 2019-06-23 DIAGNOSIS — M25562 Pain in left knee: Secondary | ICD-10-CM

## 2019-06-23 NOTE — Patient Instructions (Signed)
Vitamin D 2000 IU daily Tart Cherry 1200 mg a night Turmeric 500mg  daily Voltaren gel as needed Hinged brace with activity See me again in 5-6 weeks

## 2019-06-23 NOTE — Progress Notes (Signed)
[facility] [address], [place] [address] Phone: [phone] Subjective:   [name], am serving as a scribe for Dr. Hulan Saas.  I'm seeing this patient by the request  of:  Billey Chang, MD  CC: Left knee pain and instability  [id number]  Brittney Holt is a 68 y.o. female coming in with complaint of left knee pain. Gave out on her one month ago. Patient wants to know what is going on with her knee as she wants to be active and enjoy her grandkids safely.  Patient states since the initial wanted to has not give her any trouble.  Has noted some mild discomfort when she goes up or down stairs on a regular basis.     Past Medical History:  Diagnosis Date  . Allergy   . Basal cell carcinoma    pt is followed by Dr. Derrel Nip for this  . History of chicken pox    Past Surgical History:  Procedure Laterality Date  . TUBAL LIGATION     Social History   Socioeconomic History  . Marital status: Divorced    Spouse name: Not on file  . Number of children: Not on file  . Years of education: Not on file  . Highest education level: Not on file  Occupational History  . Not on file  Social Needs  . Financial resource strain: Not on file  . Food insecurity    Worry: Not on file    Inability: Not on file  . Transportation needs    Medical: Not on file    Non-medical: Not on file  Tobacco Use  . Smoking status: Never Smoker  . Smokeless tobacco: Never Used  Substance and Sexual Activity  . Alcohol use: Yes  . Drug use: No  . Sexual activity: Not on file  Lifestyle  . Physical activity    Days per week: Not on file    Minutes per session: Not on file  . Stress: Not on file  Relationships  . Social Herbalist on phone: Not on file    Gets together: Not on file    Attends religious service: Not on file    Active member of club or organization: Not on file    Attends meetings of clubs or organizations: Not on  file    Relationship status: Not on file  Other Topics Concern  . Not on file  Social History Narrative  . Not on file   Allergies  Allergen Reactions  . Levaquin [Levofloxacin] Other (See Comments)    States that it raised something in her blood   Family History  Problem Relation Age of Onset  . Cancer Mother   . Heart disease Father   . Heart attack Father   . Heart attack Sister   . COPD Sister   . Asthma Daughter          Current Outpatient Medications (Other):  Marland Kitchen  ALPRAZolam (XANAX) 0.25 MG tablet, Take 1 tablet (0.25 mg total) by mouth daily as needed for anxiety.    Past medical history, social, surgical and family history all reviewed in electronic medical record.  No pertanent information unless stated regarding to the chief complaint.   Review of Systems:  No headache, visual changes, nausea, vomiting, diarrhea, constipation, dizziness, abdominal pain, skin rash, fevers, chills, night sweats, weight loss, swollen lymph nodes, body aches, joint swelling,  chest pain, shortness of breath, mood  changes.  Positive muscle aches  Objective  Blood pressure 120/88, pulse (!) 116, height 5' 2.5" (1.588 m), SpO2 97 %.    General: No apparent distress alert and oriented x3 mood and affect normal, dressed appropriately.  HEENT: Pupils equal, extraocular movements intact  Respiratory: Patient's speak in full sentences and does not appear short of breath  Cardiovascular: No lower extremity edema, non tender, no erythema  Skin: Warm dry intact with no signs of infection or rash on extremities or on axial skeleton.  Abdomen: Soft nontender  Neuro: Cranial nerves II through XII are intact, neurovascularly intact in all extremities with 2+ DTRs and 2+ pulses.  Lymph: No lymphadenopathy of posterior or anterior cervical chain or axillae bilaterally.  Gait antalgic gait MSK:  Non tender with full range of motion and good stability and symmetric strength and tone of shoulders,  elbows, wrist, hip, and ankles bilaterally.  Knee: Left valgus deformity noted.  Abnormal thigh to calf ratio.  Tender to palpation over PF joint line.  ROM full in flexion and extension and lower leg rotation..  painful patellar compression. Patellar glide with moderate crepitus. Patellar and quadriceps tendons unremarkable. Hamstring and quadriceps strength is normal. 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  No significant acute meniscal tear is noted Impression: Patellofemoral arthritis medial compartment arthritis  Impression and Recommendations:     This case required medical decision making of moderate complexity. The above documentation has been reviewed and is accurate and complete Lyndal Pulley, DO       Note: This dictation was prepared with Dragon dictation along with smaller phrase technology. Any transcriptional errors that result from this process are unintentional.

## 2019-06-23 NOTE — Assessment & Plan Note (Signed)
Patient does have patellofemoral arthritis.  I do believe there is some medial compartment arthritis but does not appear to have significant lateral compartment.  Patient given a hinged brace to help patient with some confidence in the feeling of instability.  We discussed with patient about icing regimen and home exercises.  We discussed which activities to do which wants to avoid.

## 2019-08-02 ENCOUNTER — Other Ambulatory Visit: Payer: Self-pay

## 2019-08-02 ENCOUNTER — Encounter: Payer: Self-pay | Admitting: Family Medicine

## 2019-08-02 ENCOUNTER — Ambulatory Visit (INDEPENDENT_AMBULATORY_CARE_PROVIDER_SITE_OTHER): Payer: Medicare Other | Admitting: Family Medicine

## 2019-08-02 DIAGNOSIS — M1712 Unilateral primary osteoarthritis, left knee: Secondary | ICD-10-CM

## 2019-08-02 NOTE — Patient Instructions (Signed)
[facility] look up "total support" Brittney Holt  Call us if you need [place] [phone]

## 2019-08-02 NOTE — Assessment & Plan Note (Signed)
Significant improvement with conservative therapy.  Regimen and home exercise, we discussed as long as patient does well can follow-up as needed.  Patient will be moving.  Future considerations including formal physical therapy and injections.

## 2019-08-02 NOTE — Progress Notes (Signed)
[facility] [address] [place], [place] [address] Phone: [phone] Subjective:   I Brittney Holt am serving as a Education administrator for Dr. Hulan Saas.     CC: Knee pain follow-up  [id number]   06/23/2019 Patient does have patellofemoral arthritis.  I do believe there is some medial compartment arthritis but does not appear to have significant lateral compartment.  Patient given a hinged brace to help patient with some confidence in the feeling of instability.  We discussed with patient about icing regimen and home exercises.  We discussed which activities to do which wants to avoid.  08/02/2019 Brittney Holt is a 68 y.o. female coming in with complaint of left knee pain. States the knee is doing amazing.  Patient states that she is feeling 95% better.  Still mild twinges but nothing severe.  Feels like the vitamin supplementations have been the most helpful     Past Medical History:  Diagnosis Date  . Allergy   . Basal cell carcinoma    pt is followed by Dr. Derrel Nip for this  . History of chicken pox    Past Surgical History:  Procedure Laterality Date  . TUBAL LIGATION     Social History   Socioeconomic History  . Marital status: Divorced    Spouse name: Not on file  . Number of children: Not on file  . Years of education: Not on file  . Highest education level: Not on file  Occupational History  . Not on file  Social Needs  . Financial resource strain: Not on file  . Food insecurity    Worry: Not on file    Inability: Not on file  . Transportation needs    Medical: Not on file    Non-medical: Not on file  Tobacco Use  . Smoking status: Never Smoker  . Smokeless tobacco: Never Used  Substance and Sexual Activity  . Alcohol use: Yes  . Drug use: No  . Sexual activity: Not on file  Lifestyle  . Physical activity    Days per week: Not on file    Minutes per session: Not on file  . Stress: Not on file  Relationships  . Social  Herbalist on phone: Not on file    Gets together: Not on file    Attends religious service: Not on file    Active member of club or organization: Not on file    Attends meetings of clubs or organizations: Not on file    Relationship status: Not on file  Other Topics Concern  . Not on file  Social History Narrative  . Not on file   Allergies  Allergen Reactions  . Levaquin [Levofloxacin] Other (See Comments)    States that it raised something in her blood   Family History  Problem Relation Age of Onset  . Cancer Mother   . Heart disease Father   . Heart attack Father   . Heart attack Sister   . COPD Sister   . Asthma Daughter          Current Outpatient Medications (Other):  Marland Kitchen  ALPRAZolam (XANAX) 0.25 MG tablet, Take 1 tablet (0.25 mg total) by mouth daily as needed for anxiety.    Past medical history, social, surgical and family history all reviewed in electronic medical record.  No pertanent information unless stated regarding to the chief complaint.   Review of Systems:  No headache, visual changes, nausea, vomiting,  diarrhea, constipation, dizziness, abdominal pain, skin rash, fevers, chills, night sweats, weight loss, swollen lymph nodes, body aches, joint swelling, muscle aches, chest pain, shortness of breath, mood changes.   Objective  Blood pressure 140/90, pulse 90, height 5' 2.5" (1.588 m), weight 186 lb (84.4 kg), SpO2 96 %.    General: No apparent distress alert and oriented x3 mood and affect normal, dressed appropriately.  HEENT: Pupils equal, extraocular movements intact  Respiratory: Patient's speak in full sentences and does not appear short of breath  Cardiovascular: No lower extremity edema, non tender, no erythema  Skin: Warm dry intact with no signs of infection or rash on extremities or on axial skeleton.  Abdomen: Soft nontender  Neuro: Cranial nerves II through XII are intact, neurovascularly intact in all extremities with 2+  DTRs and 2+ pulses.  Lymph: No lymphadenopathy of posterior or anterior cervical chain or axillae bilaterally.  Gait normal with good balance and coordination.  MSK:  Non tender with full range of motion and good stability and symmetric strength and tone of shoulders, elbows, wrist, hip and ankles bilaterally.   Left knee does have some positive grinding over the patella.  Does have some lateral tracking.  Mild discomfort diffusely.  Mild instability of the patella but otherwise fairly unremarkable    Impression and Recommendations:      The above documentation has been reviewed and is accurate and complete Lyndal Pulley, DO       Note: This dictation was prepared with Dragon dictation along with smaller phrase technology. Any transcriptional errors that result from this process are unintentional.

## 2019-09-06 ENCOUNTER — Other Ambulatory Visit: Payer: Self-pay

## 2019-09-06 ENCOUNTER — Ambulatory Visit (INDEPENDENT_AMBULATORY_CARE_PROVIDER_SITE_OTHER): Payer: Medicare Other | Admitting: Family Medicine

## 2019-09-06 ENCOUNTER — Encounter: Payer: Self-pay | Admitting: Family Medicine

## 2019-09-06 VITALS — BP 138/86 | HR 95 | Temp 97.9°F | Ht 62.0 in | Wt 187.6 lb

## 2019-09-06 DIAGNOSIS — M8580 Other specified disorders of bone density and structure, unspecified site: Secondary | ICD-10-CM | POA: Diagnosis not present

## 2019-09-06 DIAGNOSIS — F419 Anxiety disorder, unspecified: Secondary | ICD-10-CM | POA: Diagnosis not present

## 2019-09-06 DIAGNOSIS — Z1239 Encounter for other screening for malignant neoplasm of breast: Secondary | ICD-10-CM

## 2019-09-06 DIAGNOSIS — Z0000 Encounter for general adult medical examination without abnormal findings: Secondary | ICD-10-CM | POA: Diagnosis not present

## 2019-09-06 DIAGNOSIS — E782 Mixed hyperlipidemia: Secondary | ICD-10-CM | POA: Diagnosis not present

## 2019-09-06 LAB — COMPREHENSIVE METABOLIC PANEL
ALT: 17 U/L (ref 0–35)
AST: 23 U/L (ref 0–37)
Albumin: 4 g/dL (ref 3.5–5.2)
Alkaline Phosphatase: 65 U/L (ref 39–117)
BUN: 12 mg/dL (ref 6–23)
CO2: 25 mEq/L (ref 19–32)
Calcium: 9.5 mg/dL (ref 8.4–10.5)
Chloride: 105 mEq/L (ref 96–112)
Creatinine, Ser: 0.82 mg/dL (ref 0.40–1.20)
GFR: 68.88 mL/min (ref >60.00)
Glucose, Bld: 102 mg/dL — ABNORMAL HIGH (ref 70–99)
Potassium: 4.5 mEq/L (ref 3.5–5.1)
Sodium: 139 mEq/L (ref 135–145)
Total Bilirubin: 0.5 mg/dL (ref 0.2–1.2)
Total Protein: 7.2 g/dL (ref 6.0–8.3)

## 2019-09-06 LAB — CBC WITH DIFFERENTIAL/PLATELET
Basophils Absolute: 0 10*3/uL (ref 0.0–0.1)
Basophils Relative: 0.4 % (ref 0.0–3.0)
Eosinophils Absolute: 0.1 10*3/uL (ref 0.0–0.7)
Eosinophils Relative: 0.9 % (ref 0.0–5.0)
HCT: 42.1 % (ref 36.0–46.0)
Hemoglobin: 14.2 g/dL (ref 12.0–15.0)
Lymphocytes Relative: 28.7 % (ref 12.0–46.0)
Lymphs Abs: 2.5 10*3/uL (ref 0.7–4.0)
MCHC: 33.8 g/dL (ref 30.0–36.0)
MCV: 93.7 fl (ref 78.0–100.0)
Monocytes Absolute: 0.7 10*3/uL (ref 0.1–1.0)
Monocytes Relative: 8 % (ref 3.0–12.0)
Neutro Abs: 5.5 10*3/uL (ref 1.4–7.7)
Neutrophils Relative %: 62 % (ref 43.0–77.0)
Platelets: 292 10*3/uL (ref 150.0–400.0)
RBC: 4.5 Mil/uL (ref 3.87–5.11)
RDW: 13.5 % (ref 11.5–15.5)
WBC: 8.8 10*3/uL (ref 4.0–10.5)

## 2019-09-06 LAB — LIPID PANEL
Cholesterol: 250 mg/dL — ABNORMAL HIGH (ref 0–200)
HDL: 67.5 mg/dL (ref >39.00)
LDL Cholesterol: 147 mg/dL — ABNORMAL HIGH (ref 0–99)
NonHDL: 182.72
Total CHOL/HDL Ratio: 4
Triglycerides: 181 mg/dL — ABNORMAL HIGH (ref 0.0–149.0)
VLDL: 36.2 mg/dL (ref 0.0–40.0)

## 2019-09-06 LAB — VITAMIN D 25 HYDROXY (VIT D DEFICIENCY, FRACTURES): VITD: 29.26 ng/mL — ABNORMAL LOW (ref 30.00–100.00)

## 2019-09-06 MED ORDER — FLUTICASONE PROPIONATE 50 MCG/ACT NA SUSP: 2.0000 | Freq: Every day | NASAL | 2 refills | Status: DC

## 2019-09-06 MED ORDER — SERTRALINE HCL 50 MG PO TABS: 50.0000 mg | ORAL_TABLET | Freq: Every day | ORAL | 1 refills | Status: DC

## 2019-09-06 NOTE — Patient Instructions (Signed)
We are going to start zoloft on you. Take 1/2 pill daily x 1 week, then bump up to one full pill/day. Will see you back in one month to make sure you are doing okay. Also exercise is so important for this.   Generalized Anxiety Disorder, Adult Generalized anxiety disorder (GAD) is a mental health disorder. People with this condition constantly worry about everyday events. Unlike normal anxiety, worry related to GAD is not triggered by a specific event. These worries also do not fade or get better with time. GAD interferes with life functions, including relationships, work, and school. GAD can vary from mild to severe. People with severe GAD can have intense waves of anxiety with physical symptoms (panic attacks). What are the causes? The exact cause of GAD is not known. What increases the risk? This condition is more likely to develop in:  Women.  People who have a family history of anxiety disorders.  People who are very shy.  People who experience very stressful life events, such as the death of a loved one.  People who have a very stressful family environment. What are the signs or symptoms? People with GAD often worry excessively about many things in their lives, such as their health and family. They may also be overly concerned about:  Doing well at work.  Being on time.  Natural disasters.  Friendships. Physical symptoms of GAD include:  Fatigue.  Muscle tension or having muscle twitches.  Trembling or feeling shaky.  Being easily startled.  Feeling like your heart is pounding or racing.  Feeling out of breath or like you cannot take a deep breath.  Having trouble falling asleep or staying asleep.  Sweating.  Nausea, diarrhea, or irritable bowel syndrome (IBS).  Headaches.  Trouble concentrating or remembering facts.  Restlessness.  Irritability. How is this diagnosed? Your health care provider can diagnose GAD based on your symptoms and medical history.  You will also have a physical exam. The health care provider will ask specific questions about your symptoms, including how severe they are, when they started, and if they come and go. Your health care provider may ask you about your use of alcohol or drugs, including prescription medicines. Your health care provider may refer you to a mental health specialist for further evaluation. Your health care provider will do a thorough examination and may perform additional tests to rule out other possible causes of your symptoms. To be diagnosed with GAD, a person must have anxiety that:  Is out of his or her control.  Affects several different aspects of his or her life, such as work and relationships.  Causes distress that makes him or her unable to take part in normal activities.  Includes at least three physical symptoms of GAD, such as restlessness, fatigue, trouble concentrating, irritability, muscle tension, or sleep problems. Before your health care provider can confirm a diagnosis of GAD, these symptoms must be present more days than they are not, and they must last for six months or longer. How is this treated? The following therapies are usually used to treat GAD:  Medicine. Antidepressant medicine is usually prescribed for long-term daily control. Antianxiety medicines may be added in severe cases, especially when panic attacks occur.  Talk therapy (psychotherapy). Certain types of talk therapy can be helpful in treating GAD by providing support, education, and guidance. Options include: ? Cognitive behavioral therapy (CBT). People learn coping skills and techniques to ease their anxiety. They learn to identify unrealistic or negative  thoughts and behaviors and to replace them with positive ones. ? Acceptance and commitment therapy (ACT). This treatment teaches people how to be mindful as a way to cope with unwanted thoughts and feelings. ? Biofeedback. This process trains you to manage your  body's response (physiological response) through breathing techniques and relaxation methods. You will work with a therapist while machines are used to monitor your physical symptoms.  Stress management techniques. These include yoga, meditation, and exercise. A mental health specialist can help determine which treatment is best for you. Some people see improvement with one type of therapy. However, other people require a combination of therapies. Follow these instructions at home:  Take over-the-counter and prescription medicines only as told by your health care provider.  Try to maintain a normal routine.  Try to anticipate stressful situations and allow extra time to manage them.  Practice any stress management or self-calming techniques as taught by your health care provider.  Do not punish yourself for setbacks or for not making progress.  Try to recognize your accomplishments, even if they are small.  Keep all follow-up visits as told by your health care provider. This is important. Contact a health care provider if:  Your symptoms do not get better.  Your symptoms get worse.  You have signs of depression, such as: ? A persistently sad, cranky, or irritable mood. ? Loss of enjoyment in activities that used to bring you joy. ? Change in weight or eating. ? Changes in sleeping habits. ? Avoiding friends or family members. ? Loss of energy for normal tasks. ? Feelings of guilt or worthlessness. Get help right away if:  You have serious thoughts about hurting yourself or others. If you ever feel like you may hurt yourself or others, or have thoughts about taking your own life, get help right away. You can go to your nearest emergency department or call:  Your local emergency services (911 in the [place]  A suicide crisis helpline, such as the [facility] at 510 045 6151. This is open 24 hours a day. Summary  Generalized anxiety disorder (GAD) is  a mental health disorder that involves worry that is not triggered by a specific event.  People with GAD often worry excessively about many things in their lives, such as their health and family.  GAD may cause physical symptoms such as restlessness, trouble concentrating, sleep problems, frequent sweating, nausea, diarrhea, headaches, and trembling or muscle twitching.  A mental health specialist can help determine which treatment is best for you. Some people see improvement with one type of therapy. However, other people require a combination of therapies. This information is not intended to replace advice given to you by your health care provider. Make sure you discuss any questions you have with your health care provider. Document Released: 04/10/2013 Document Revised: 11/26/2017 Document Reviewed: 11/03/2016 Elsevier Patient Education  2020 [employer].

## 2019-09-06 NOTE — Addendum Note (Signed)
Addended by: Francis Dowse T on: 09/06/2019 11:16 AM   Modules accepted: Orders

## 2019-09-06 NOTE — Progress Notes (Signed)
Phone: [phone]  Subjective:  Patient presents today for their Medicare Exam. Also has hx of osteopenia and hyperlipidemia on no medication. She has some concerns about anxiety.   She states she is moving to [place], selling her house and tryign to not worry about the state of the world affairs. She has a bi polar daughter who is unemployed and living with her and she isn't sure she will ever be able to work again or even live on own and this causes her anxiety. The news causes her anxiety. No panic attacks. She is not exercising like she should. She states she was on zoloft a long time ago in the past when going through her divorce and this helped her a lot. She interested in starting this back during this anxiety filled time.   Preventive Screening-Counseling & Management  Advanced directives: not currently  Smoking Status: Never Smoker Second Hand Smoking status: No smokers in home  Risk Factors Regular exercise: tried to walk regularly Diet: lots of stress  Fall Risk: None  Fall Risk  09/06/2019 07/26/2018  Falls in the past year? 0 No  Number falls in past yr: 0 -  Injury with Fall? 0 -   Opioid use history:  no long term opioids use  Cardiac risk factors:  advanced age (older than 63 for men, 21 for women) yes Hyperlipidemia yes No diabetes. none Family History: father had several MI and bypass surgery.   Depression Screen None. PHQ2  Depression screen [facility] 2/9 09/06/2019 01/06/2019 07/26/2018  Decreased Interest 1 0 0  Down, Depressed, Hopeless 2 0 0  PHQ - 2 Score 3 0 0  Altered sleeping 2 - -  Tired, decreased energy 0 - -  Change in appetite 1 - -  Feeling bad or failure about yourself  0 - -  Trouble concentrating 0 - -  Moving slowly or fidgety/restless 0 - -  Suicidal thoughts 0 - -  PHQ-9 Score 6 - -  Difficult doing work/chores Not difficult at all - -    Activities of Daily Living Independent ADLs and IADLs -none  Hearing Difficulties: -patient  declines  Cognitive Testing No reported trouble.    Normal 3 word recall  List the Names of Other Physician/Practitioners you currently use: -Dr. Colletta [place]    Immunization History  Administered Date(s) Administered  . Influenza, High Dose Seasonal PF 01/06/2019  . Pneumococcal Conjugate-13 03/01/2017  . Pneumococcal Polysaccharide-23 01/06/2019  . Tdap 03/01/2017   Required Immunizations needed today flu shot.   Screening tests- up to date Health Maintenance Due  Topic Date Due  . COLONOSCOPY  06/28/1999  . MAMMOGRAM  03/12/2019  . INFLUENZA VACCINE  07/29/2019    Review of Systems  Constitutional: Negative for chills, fever and malaise/fatigue.  HENT: Negative for hearing loss and sore throat.   Eyes: Negative for blurred vision and double vision.  Respiratory: Negative for cough, shortness of breath and wheezing.   Cardiovascular: Negative for chest pain, palpitations and leg swelling.  Gastrointestinal: Negative for abdominal pain, blood in stool, nausea and vomiting.  Genitourinary: Negative for dysuria and hematuria.  Musculoskeletal: Negative for falls.  Skin: Negative for rash.  Neurological: Negative for dizziness and weakness.  Psychiatric/Behavioral: Negative for memory loss and suicidal ideas. The patient is nervous/anxious. The patient does not have insomnia.      The following were reviewed and entered/updated in epic: Past Medical History:  Diagnosis Date  . Allergy   . Basal cell carcinoma  pt is followed by Dr. Derrel Nip for this  . History of chicken pox    Patient Active Problem List   Diagnosis Date Noted  . Patellofemoral arthritis of left knee 06/23/2019  . Osteopenia 01/06/2019  . Hyperlipidemia 01/06/2019   Past Surgical History:  Procedure Laterality Date  . TUBAL LIGATION      Family History  Problem Relation Age of Onset  . Cancer Mother   . Heart disease Father   . Heart attack Father   . Heart  attack Sister   . COPD Sister   . Asthma Daughter     Medications- reviewed and updated Current Outpatient Medications  Medication Sig Dispense Refill  . ALPRAZolam (XANAX) 0.25 MG tablet Take 1 tablet (0.25 mg total) by mouth daily as needed for anxiety. 10 tablet 0  . Cholecalciferol 50 MCG (2000 UT) CHEW Chew by mouth.    Javier Docker Oil 1000 MG CAPS Take by mouth.    . Turmeric 500 MG CAPS Take by mouth.    . fluticasone (FLONASE) 50 MCG/ACT nasal spray Place 2 sprays into both nostrils daily. 48 g 2  . sertraline (ZOLOFT) 50 MG tablet Take 1 tablet (50 mg total) by mouth daily. 30 tablet 1   No current facility-administered medications for this visit.     Allergies-reviewed and updated Allergies  Allergen Reactions  . Levaquin [Levofloxacin] Other (See Comments)    States that it raised something in her blood    Social History   Socioeconomic History  . Marital status: Divorced    Spouse name: Not on file  . Number of children: Not on file  . Years of education: Not on file  . Highest education level: Not on file  Occupational History  . Not on file  Social Needs  . Financial resource strain: Not on file  . Food insecurity    Worry: Not on file    Inability: Not on file  . Transportation needs    Medical: Not on file    Non-medical: Not on file  Tobacco Use  . Smoking status: Never Smoker  . Smokeless tobacco: Never Used  Substance and Sexual Activity  . Alcohol use: Yes  . Drug use: No  . Sexual activity: Not on file  Lifestyle  . Physical activity    Days per week: Not on file    Minutes per session: Not on file  . Stress: Not on file  Relationships  . Social Herbalist on phone: Not on file    Gets together: Not on file    Attends religious service: Not on file    Active member of club or organization: Not on file    Attends meetings of clubs or organizations: Not on file    Relationship status: Not on file  Other Topics Concern  . Not on  file  Social History Narrative  . Not on file    Objective: BP 138/86 (BP Location: Left Arm, Patient Position: Sitting, Cuff Size: Large)   Pulse 95   Temp 97.9 F (36.6 C) (Skin)   Ht 5\' 2"  (1.575 m)   Wt 187 lb 9.6 oz (85.1 kg)   LMP  (LMP Unknown)   SpO2 100%   BMI 34.31 kg/m   Physical Exam  Constitutional: She is oriented to person, place, and time and well-developed, well-nourished, and in no distress.  HENT:  Head: Normocephalic and atraumatic.  Tm partially obscured with cerumen bilaterally.  Mild cobblestoning on  posterior pharynx   Eyes: Pupils are equal, round, and reactive to light. Conjunctivae and EOM are normal.  Neck: Normal range of motion. Neck supple. No thyromegaly present.  No carotid bruits   Cardiovascular: Normal rate, regular rhythm, normal heart sounds and intact distal pulses.  No murmur heard. Pulmonary/Chest: Effort normal and breath sounds normal.  Abdominal: Soft. Bowel sounds are normal.  Musculoskeletal: Normal range of motion.        General: Edema (mild pedal edema bilaterally ) present.  Lymphadenopathy:    She has no cervical adenopathy.  Neurological: She is alert and oriented to person, place, and time. She displays normal reflexes. No cranial nerve deficit.  Skin: Skin is warm and dry. No rash noted.  Psychiatric: Mood, memory, affect and judgment normal.  Vitals reviewed.   Assessment/Plan: Depression screen [facility] 2/9 09/06/2019 01/06/2019 07/26/2018  Decreased Interest 1 0 0  Down, Depressed, Hopeless 2 0 0  PHQ - 2 Score 3 0 0  Altered sleeping 2 - -  Tired, decreased energy 0 - -  Change in appetite 1 - -  Feeling bad or failure about yourself  0 - -  Trouble concentrating 0 - -  Moving slowly or fidgety/restless 0 - -  Suicidal thoughts 0 - -  PHQ-9 Score 6 - -  Difficult doing work/chores Not difficult at all - -   GAD 7 : Generalized Anxiety Score 09/06/2019  Nervous, Anxious, on Edge 2  Control/stop worrying 1  Worry  too much - different things 3  Trouble relaxing 0  Restless 0  Easily annoyed or irritable 3  Afraid - awful might happen 3  Total GAD 7 Score 12  Anxiety Difficulty Not difficult at all      Status of chronic or acute concerns  1. Medicare annual wellness visit, subsequent Discussed HM. She has the cologuard and is waiting for hemorrhoids to quiet down so she doesn't get a false positive. Referral for dexa/mmg today. Flu shot today, otherwise utd on HM. All other preventative services discussed. Does not have AD. Not a fall risk.   2. Osteopenia, unspecified location  - VITAMIN D 25 Hydroxy (Vit-D Deficiency, Fractures) - DG Bone Density; Future  3. Mixed hyperlipidemia  - CBC with Differential/Platelet - Lipid panel - Comprehensive metabolic panel  4. Breast cancer screening  - MM Digital Screening; Future  5. Anxiety GAD7 score moderately elevated at a 12. Could be more situational in nature. zoloft worked well for her in the past and we will start this again. Also want her to exercise as this is one of the best treatments for anxiety. Want her to start at 25mg  of her zoloft x 1 week then bump up to a full pill daily. I've explained to her that drugs of the SSRI class can have side effects such as weight gain, sexual dysfunction, insomnia, headache, nausea. These medications are generally effective at alleviating symptoms of anxiety and/or depression. Let me know if significant side effects do occur. Also can have increased si/hi and if this happens to call 911 or go to ED. F/u in one month with doxy.     Return in about 1 month (around 10/06/2019) for doxy visit (thursday pleaes) for anxiety follow up. .   Lab/Order associations: Medicare annual wellness visit, subsequent  Osteopenia, unspecified location - Plan: VITAMIN D 25 Hydroxy (Vit-D Deficiency, Fractures), DG Bone Density  Mixed hyperlipidemia - Plan: CBC with Differential/Platelet, Lipid panel, Comprehensive  metabolic panel  Breast cancer  screening - Plan: MM Digital Screening  Anxiety  Meds ordered this encounter  Medications  . sertraline (ZOLOFT) 50 MG tablet    Sig: Take 1 tablet (50 mg total) by mouth daily.    Dispense:  30 tablet    Refill:  1  . fluticasone (FLONASE) 50 MCG/ACT nasal spray    Sig: Place 2 sprays into both nostrils daily.    Dispense:  48 g    Refill:  2    Return precautions advised. Orma Flaming, MD

## 2019-09-07 ENCOUNTER — Encounter: Payer: Self-pay | Admitting: Family Medicine

## 2019-09-09 ENCOUNTER — Encounter: Payer: Self-pay | Admitting: Family Medicine

## 2019-09-14 ENCOUNTER — Encounter: Payer: Self-pay | Admitting: Family Medicine

## 2019-09-14 ENCOUNTER — Ambulatory Visit (INDEPENDENT_AMBULATORY_CARE_PROVIDER_SITE_OTHER): Payer: Medicare Other | Admitting: Family Medicine

## 2019-09-14 VITALS — Ht 62.0 in | Wt 181.3 lb

## 2019-09-14 DIAGNOSIS — F419 Anxiety disorder, unspecified: Secondary | ICD-10-CM

## 2019-09-14 DIAGNOSIS — R739 Hyperglycemia, unspecified: Secondary | ICD-10-CM

## 2019-09-14 DIAGNOSIS — E782 Mixed hyperlipidemia: Secondary | ICD-10-CM

## 2019-09-14 MED ORDER — ROSUVASTATIN CALCIUM 5 MG PO TABS: 5.0000 mg | ORAL_TABLET | Freq: Every day | ORAL | 3 refills | Status: DC

## 2019-09-14 NOTE — Progress Notes (Deleted)
Patient: Brittney Holt MRN: [id number] DOB: 1951-10-16 PCP: Orma Flaming, MD     Subjective:  Chief Complaint  Patient presents with  . Medication Management    HPI: The patient is a 68 y.o. female who presents today for ***  Review of Systems  Constitutional: Negative for fatigue.  HENT: Negative for congestion, postnasal drip, rhinorrhea and sore throat.   Eyes: Negative for visual disturbance.  Respiratory: Negative for chest tightness and shortness of breath.   Cardiovascular: Negative for chest pain, palpitations and leg swelling.  Gastrointestinal: Negative for abdominal pain, diarrhea, nausea and vomiting.  Musculoskeletal: Negative for back pain and neck pain.  Neurological: Negative for dizziness and headaches.  Psychiatric/Behavioral: Negative for sleep disturbance. The patient is not nervous/anxious.     Allergies Patient is allergic to levaquin [levofloxacin].  Past Medical History Patient  has a past medical history of Allergy, Basal cell carcinoma, and History of chicken pox.  Surgical History Patient  has a past surgical history that includes Tubal ligation.  Family History Pateint's family history includes Asthma in her daughter; COPD in her sister; Cancer in her mother; Heart attack in her father and sister; Heart disease in her father.  Social History Patient  reports that she has never smoked. She has never used smokeless tobacco. She reports current alcohol use. She reports that she does not use drugs.    Objective: Vitals:   09/14/19 0758  Weight: 181 lb 4.8 oz (82.2 kg)  Height: 5\' 2"  (1.575 m)    Body mass index is 33.16 kg/m.  Physical Exam     Assessment/plan:      No follow-ups on file.     @AWME @ 09/14/2019

## 2019-09-14 NOTE — Progress Notes (Signed)
Patient: Brittney Holt MRN: [id number] DOB: [birth year] PCP: Orma Flaming, MD     I connected with [name] on 09/14/19 at 8:18am by a video enabled telemedicine application and verified that I am speaking with the correct person using two identifiers.  Location patient: Home Location provider: [facility], Office Persons participating in this virtual visit: Stevi Calk and Dr. Rogers Blocker   I discussed the limitations of evaluation and management by telemedicine and the availability of in person appointments. The patient expressed understanding and agreed to proceed.   Subjective:  Chief Complaint  Patient presents with  . Medication Management    HPI: The patient is a 68 y.o. female who presents today to discuss statin therapy. Her ASCVD risk is 11%. She has had same risk for the past year +. All of her siblings are on a statin. She wants to discuss risk/benefits and side effects of medication.   Also we started zoloft for her at last appointment and she is doing great on this. She is tolerating well and feels like her mental health is much improved. No side effects and no si/hi/ah/vh.     Review of Systems  Constitutional: Negative for chills, fatigue and fever.  HENT: Negative for dental problem, ear pain, hearing loss and trouble swallowing.   Eyes: Negative for visual disturbance.  Respiratory: Negative for cough, chest tightness and shortness of breath.   Cardiovascular: Negative for chest pain, palpitations and leg swelling.  Gastrointestinal: Negative for abdominal pain, blood in stool, diarrhea and nausea.  Endocrine: Negative for cold intolerance, polydipsia, polyphagia and polyuria.  Genitourinary: Negative for dysuria and hematuria.  Musculoskeletal: Negative for arthralgias.  Skin: Negative for rash.  Neurological: Negative for dizziness and headaches.  Psychiatric/Behavioral: Negative for dysphoric mood and sleep disturbance. The patient is not  nervous/anxious.     Allergies Patient is allergic to levaquin [levofloxacin].  Past Medical History Patient  has a past medical history of Allergy, Basal cell carcinoma, and History of chicken pox.  Surgical History Patient  has a past surgical history that includes Tubal ligation.  Family History Pateint's family history includes Asthma in her daughter; COPD in her sister; Cancer in her mother; Heart attack in her father and sister; Heart disease in her father.  Social History Patient  reports that she has never smoked. She has never used smokeless tobacco. She reports current alcohol use. She reports that she does not use drugs.    Objective: Vitals:   09/14/19 0758  Weight: 181 lb 4.8 oz (82.2 kg)  Height: 5\' 2"  (1.575 m)    Body mass index is 33.16 kg/m.  Physical Exam Vitals signs reviewed.  Constitutional:      Appearance: Normal appearance. She is obese.  Pulmonary:     Effort: Pulmonary effort is normal.  Neurological:     General: No focal deficit present.     Mental Status: She is alert.        Assessment/plan: 1. Mixed hyperlipidemia Discussed risks/benefits of starting statin and side effects. Answered all of her questions. We are going to proceed with low dose crestor to treat her ascvd risk. F/u in 3 months with labs and she is to let me know if any issues.  - Comprehensive metabolic panel; Future - Lipid panel; Future  2. Elevated blood sugar a1c of 5.6 about 6 months ago, will watch with addition of statin and check in 3 months.  - Hemoglobin A1c; Future  3. Anxiety Tolerating her zoloft well.  Improvement in her overall mental health and she is very happy about this. Working on weight loss and exercise as well. Continue current dosing. F/u in 6 months.      Return in about 3 months (around 12/14/2019) for labs.    Orma Flaming, MD [place]  09/14/2019

## 2019-09-14 NOTE — Progress Notes (Deleted)
Patient: Brittney Holt MRN: [id number] DOB: [birth year] PCP: Orma Flaming, MD     I connected with [name] on 09/14/19 at @CHLAPPTIME @ by a video enabled telemedicine application and verified that I am speaking with the correct person using two identifiers.  Location patient: Home Location provider: [facility], Office Persons participating in this virtual visit: ***  I discussed the limitations of evaluation and management by telemedicine and the availability of in person appointments. The patient expressed understanding and agreed to proceed.   Subjective:  Chief Complaint  Patient presents with  . Medication Management    HPI: The patient is a 68 y.o. female who presents today for ***  Review of Systems  Allergies Patient is allergic to levaquin [levofloxacin].  Past Medical History Patient  has a past medical history of Allergy, Basal cell carcinoma, and History of chicken pox.  Surgical History Patient  has a past surgical history that includes Tubal ligation.  Family History Pateint's family history includes Asthma in her daughter; COPD in her sister; Cancer in her mother; Heart attack in her father and sister; Heart disease in her father.  Social History Patient  reports that she has never smoked. She has never used smokeless tobacco. She reports current alcohol use. She reports that she does not use drugs.    Objective: Vitals:   09/14/19 0758  Weight: 181 lb 4.8 oz (82.2 kg)  Height: 5\' 2"  (1.575 m)    Body mass index is 33.16 kg/m.  Physical Exam     Assessment/plan:      No follow-ups on file.  Records requested if needed. Time spent with patient: *** minutes, of which >50% was spent in obtaining information about her symptoms, reviweing her previous labs, evaluations, and treatments, counseling her about her conditions (please see discussed topics above), and developing a plan to further investigate it; she had a number of questions  which I addressed.    Orma Flaming, MD [place]  09/14/2019

## 2019-10-19 ENCOUNTER — Encounter: Payer: Self-pay | Admitting: Family Medicine

## 2019-10-19 ENCOUNTER — Other Ambulatory Visit: Payer: Self-pay

## 2019-10-19 MED ORDER — SERTRALINE HCL 50 MG PO TABS: 50.0000 mg | ORAL_TABLET | Freq: Every day | ORAL | 1 refills | Status: DC

## 2019-12-12 ENCOUNTER — Encounter: Payer: Self-pay | Admitting: Family Medicine

## 2019-12-13 ENCOUNTER — Other Ambulatory Visit: Payer: Self-pay

## 2019-12-13 MED ORDER — SERTRALINE HCL 50 MG PO TABS: ORAL_TABLET | ORAL | 1 refills | Status: DC

## 2020-03-03 ENCOUNTER — Ambulatory Visit: Payer: Medicare PPO | Attending: Internal Medicine

## 2020-03-03 DIAGNOSIS — Z23 Encounter for immunization: Secondary | ICD-10-CM

## 2020-03-03 NOTE — Progress Notes (Signed)
   Covid-19 Vaccination Clinic  Name:  PETRONELLA FIESTA    MRN: [id number] DOB: [birth year]  03/03/2020  Ms. Donley was observed post Covid-19 immunization for 15 minutes without incident. She was provided with Vaccine Information Sheet and instruction to access the V-Safe system.   Ms. Rivest was instructed to call 911 with any severe reactions post vaccine: Marland Kitchen Difficulty breathing  . Swelling of face and throat  . A fast heartbeat  . A bad rash all over body  . Dizziness and weakness   Immunizations Administered    Name Date Dose VIS Date Route   Pfizer COVID-19 Vaccine 03/03/2020 12:26 PM 0.3 mL 12/08/2019 Intramuscular   Manufacturer: [place]   Lot: [id number]   [place]: [id number]

## 2020-03-21 ENCOUNTER — Other Ambulatory Visit: Payer: Self-pay

## 2020-03-21 MED ORDER — ROSUVASTATIN CALCIUM 5 MG PO TABS: 5.0000 mg | ORAL_TABLET | Freq: Every day | ORAL | 0 refills | Status: AC

## 2020-04-01 ENCOUNTER — Ambulatory Visit: Payer: Medicare PPO | Attending: Internal Medicine

## 2020-04-01 ENCOUNTER — Ambulatory Visit: Payer: Medicare PPO

## 2020-04-04 ENCOUNTER — Encounter: Payer: Self-pay | Admitting: Family Medicine

## 2020-04-08 ENCOUNTER — Ambulatory Visit: Payer: Medicare PPO

## 2020-05-30 ENCOUNTER — Other Ambulatory Visit: Payer: Self-pay

## 2020-06-05 ENCOUNTER — Other Ambulatory Visit: Payer: Self-pay

## 2020-06-05 NOTE — Progress Notes (Signed)
Pt is requesting Sertraline 50mg  tab  LOV: 09/14/2019 Last refill: 12/17/2019  Okay to refill?

## 2020-06-06 MED ORDER — SERTRALINE HCL 50 MG PO TABS: ORAL_TABLET | ORAL | 1 refills | Status: AC

## 2020-06-06 NOTE — Progress Notes (Signed)
Sent in for her. Due for appointment soon. Sent to [facility].  Orma Flaming, MD [place]

## 2020-09-28 ENCOUNTER — Other Ambulatory Visit: Payer: Self-pay | Admitting: Family Medicine

## 2020-11-02 ENCOUNTER — Other Ambulatory Visit: Payer: Self-pay | Admitting: Family Medicine

## 2021-07-10 ENCOUNTER — Telehealth: Payer: Self-pay

## 2021-07-10 NOTE — Telephone Encounter (Signed)
Called and talked to pt about Dr Rogers Blocker leaving and she stated that she has moved to [facility]
